# Patient Record
Sex: Male | Born: 1955 | Race: White | Hispanic: No | Marital: Single | Smoking: Current every day smoker
Health system: Southern US, Community
[De-identification: ages and names within clinical notes are randomized; demographics above are authoritative.]

---

## 2013-05-09 ENCOUNTER — Emergency Department: Payer: Self-pay | Admitting: Emergency Medicine

## 2015-07-20 ENCOUNTER — Emergency Department
Admission: EM | Admit: 2015-07-20 | Discharge: 2015-07-20 | Disposition: A | Discharge status: Home / Self Care | Payer: Self-pay | Attending: Emergency Medicine | Admitting: Emergency Medicine

## 2015-07-20 ENCOUNTER — Other Ambulatory Visit: Payer: Self-pay

## 2015-07-20 ENCOUNTER — Emergency Department: Payer: Self-pay

## 2015-07-20 ENCOUNTER — Encounter: Payer: Self-pay | Admitting: Emergency Medicine

## 2015-07-20 DIAGNOSIS — K253 Acute gastric ulcer without hemorrhage or perforation: Secondary | ICD-10-CM | POA: Insufficient documentation

## 2015-07-20 DIAGNOSIS — F17210 Nicotine dependence, cigarettes, uncomplicated: Secondary | ICD-10-CM | POA: Insufficient documentation

## 2015-07-20 DIAGNOSIS — Z880 Allergy status to penicillin: Secondary | ICD-10-CM | POA: Insufficient documentation

## 2015-07-20 LAB — HEPATIC FUNCTION PANEL
ALT: 10 U/L — ABNORMAL LOW (ref 17–63)
AST: 16 U/L (ref 15–41)
Albumin: 3.7 g/dL (ref 3.5–5.0)
Alkaline Phosphatase: 83 U/L (ref 38–126)
BILIRUBIN TOTAL: 0.3 mg/dL (ref 0.3–1.2)
Total Protein: 7.4 g/dL (ref 6.5–8.1)

## 2015-07-20 LAB — BASIC METABOLIC PANEL
Anion gap: 7 (ref 5–15)
BUN: 10 mg/dL (ref 6–20)
CHLORIDE: 98 mmol/L — AB (ref 101–111)
CO2: 31 mmol/L (ref 22–32)
CREATININE: 1.01 mg/dL (ref 0.61–1.24)
Calcium: 9.2 mg/dL (ref 8.9–10.3)
GFR calc Af Amer: >60 mL/min (ref >60)
GFR calc non Af Amer: >60 mL/min (ref >60)
Glucose, Bld: 117 mg/dL — ABNORMAL HIGH (ref 65–99)
Potassium: 4.1 mmol/L (ref 3.5–5.1)
Sodium: 136 mmol/L (ref 135–145)

## 2015-07-20 LAB — CBC
HCT: 36 % — ABNORMAL LOW (ref 40.0–52.0)
Hemoglobin: 12.4 g/dL — ABNORMAL LOW (ref 13.0–18.0)
MCH: 32.6 pg (ref 26.0–34.0)
MCHC: 34.4 g/dL (ref 32.0–36.0)
MCV: 94.5 fL (ref 80.0–100.0)
PLATELETS: 264 10*3/uL (ref 150–440)
RBC: 3.81 MIL/uL — AB (ref 4.40–5.90)
RDW: 13.3 % (ref 11.5–14.5)
WBC: 11.6 10*3/uL — ABNORMAL HIGH (ref 3.8–10.6)

## 2015-07-20 LAB — LIPASE, BLOOD: Lipase: 39 U/L (ref 11–51)

## 2015-07-20 LAB — TROPONIN I: Troponin I: <0.03 ng/mL (ref <0.031)

## 2015-07-20 MED ORDER — SUCRALFATE 1 G PO TABS: 1.0000 g | ORAL_TABLET | Freq: Four times a day (QID) | ORAL | Status: AC

## 2015-07-20 MED ORDER — SODIUM CHLORIDE 0.9 % IV BOLUS (SEPSIS)
1000.0000 mL | Freq: Once | INTRAVENOUS | Status: AC
  Administered 2015-07-20: 1000 mL via INTRAVENOUS

## 2015-07-20 MED ORDER — ONDANSETRON HCL 4 MG/2ML IJ SOLN
4.0000 mg | Freq: Once | INTRAMUSCULAR | Status: AC
  Administered 2015-07-20: 4 mg via INTRAVENOUS
  Filled 2015-07-20: qty 2

## 2015-07-20 MED ORDER — IOHEXOL 240 MG/ML SOLN
25.0000 mL | Freq: Once | INTRAMUSCULAR | Status: AC | PRN
  Administered 2015-07-20: 25 mL via INTRAVENOUS

## 2015-07-20 MED ORDER — GI COCKTAIL ~~LOC~~
30.0000 mL | Freq: Once | ORAL | Status: AC
  Administered 2015-07-20: 30 mL via ORAL
  Filled 2015-07-20: qty 30

## 2015-07-20 MED ORDER — OMEPRAZOLE 20 MG PO CPDR: 20.0000 mg | DELAYED_RELEASE_CAPSULE | Freq: Two times a day (BID) | ORAL | Status: AC

## 2015-07-20 MED ORDER — MORPHINE SULFATE (PF) 4 MG/ML IV SOLN
4.0000 mg | Freq: Once | INTRAVENOUS | Status: AC
  Administered 2015-07-20: 4 mg via INTRAVENOUS
  Filled 2015-07-20: qty 1

## 2015-07-20 MED ORDER — METRONIDAZOLE 500 MG PO TABS: 500.0000 mg | ORAL_TABLET | Freq: Two times a day (BID) | ORAL | Status: AC

## 2015-07-20 MED ORDER — IOHEXOL 300 MG/ML  SOLN
100.0000 mL | Freq: Once | INTRAMUSCULAR | Status: AC | PRN
  Administered 2015-07-20: 100 mL via INTRAVENOUS

## 2015-07-20 MED ORDER — CLARITHROMYCIN 500 MG PO TABS: 500.0000 mg | ORAL_TABLET | Freq: Two times a day (BID) | ORAL | Status: AC

## 2015-07-20 NOTE — ED Notes (Signed)
Patient transported to X-ray 

## 2015-07-20 NOTE — ED Provider Notes (Signed)
[facility] Emergency Department Provider Note   ____________________________________________  Time seen: 1405  I have reviewed the triage vital signs and the nursing notes.   HISTORY  Chief Complaint Abdominal Pain   History limited by: Not Limited   HPI Ricky Hopkins is a 59 y.o. male who presents to the emergency department today because of concerns for abdominal pain. He states this been located in the epigastric region. He states he feels like someone is checking a knife in his abdomen. The pain radiates to his back. It is been present for the past 2 weeks. It is worse after eating. He denies any nausea or vomiting with the pain. He states that he has had some diarrhea although he has not noticed any blood in it recently. He denies any fevers. Denies similar symptoms in the past.   History reviewed. No pertinent past medical history.  There are no active problems to display for this patient.   History reviewed. No pertinent past surgical history.  No current outpatient prescriptions on file.  Allergies Penicillins  No family history on file.  Social History Social History  Substance Use Topics  . Smoking status: Current Every Day Smoker -- 1.00 packs/day    Types: Cigarettes  . Smokeless tobacco: None  . Alcohol Use: No    Review of Systems  Constitutional: Negative for fever. Cardiovascular: Negative for chest pain. Respiratory: Negative for shortness of breath. Gastrointestinal: positive for epigastric abdominal pain Genitourinary: Negative for dysuria. Musculoskeletal: Negative for back pain. Skin: Negative for rash. Neurological: Negative for headaches, focal weakness or numbness.  10-point ROS otherwise negative.  ____________________________________________   PHYSICAL EXAM:  VITAL SIGNS: ED Triage Vitals  Enc Vitals Group     BP 07/20/15 1248 123/70 mmHg     Pulse Rate 07/20/15 1248 58     Resp 07/20/15 1248 19    Temp 07/20/15 1248 97.6 F (36.4 C)     Temp Source 07/20/15 1248 Oral     SpO2 07/20/15 1248 100 %     Weight 07/20/15 1248 130 lb (58.968 kg)     Height 07/20/15 1248 5\' 4"  (1.626 m)     Head Cir --      Peak Flow --      Pain Score 07/20/15 1307 8   Constitutional: Alert and oriented. Thin, older than his stated age Eyes: Conjunctivae are normal. PERRL. Normal extraocular movements. ENT   Head: Normocephalic and atraumatic.   Nose: No congestion/rhinnorhea.   Mouth/Throat: Mucous membranes are moist.   Neck: No stridor. Hematological/Lymphatic/Immunilogical: No cervical lymphadenopathy. Cardiovascular: Normal rate, regular rhythm.  No murmurs, rubs, or gallops. Respiratory: Normal respiratory effort without tachypnea nor retractions. Breath sounds are clear and equal bilaterally. No wheezes/rales/rhonchi. Gastrointestinal: Soft and tender to palpation epigastrium. No rebound. No guarding. No distention.  Genitourinary: Deferred Musculoskeletal: Normal range of motion in all extremities. No joint effusions.  No lower extremity tenderness nor edema. Neurologic:  Normal speech and language. No gross focal neurologic deficits are appreciated.  Skin:  Skin is warm, dry and intact. No rash noted. Psychiatric: Mood and affect are normal. Speech and behavior are normal. Patient exhibits appropriate insight and judgment.  ____________________________________________    LABS (pertinent positives/negatives)  Labs Reviewed  BASIC METABOLIC PANEL - Abnormal; Notable for the following:    Chloride 98 (*)    Glucose, Bld 117 (*)    All other components within normal limits  CBC - Abnormal; Notable for the following:  WBC 11.6 (*)    RBC 3.81 (*)    Hemoglobin 12.4 (*)    HCT 36.0 (*)    All other components within normal limits  HEPATIC FUNCTION PANEL - Abnormal; Notable for the following:    ALT 10 (*)    Bilirubin, Direct <0.1 (*)    All other components within  normal limits  TROPONIN I  LIPASE, BLOOD     ____________________________________________   EKG  I, Phineas Semen, attending physician, personally viewed and interpreted this EKG  EKG Time: 1256 Rate: 74 Rhythm: NSR Axis: normal Intervals: qtc 397 QRS: narrow ST changes: no st elevation Impression: normal ekg ____________________________________________    RADIOLOGY  CXR  IMPRESSION: COPD. No active cardiopulmonary disease.  CT abd/pel  IMPRESSION: Findings likely representing an inflamed gastric ulcer along the lesser curvature of the stomach. No evidence of perforation. Acute pancreatitis is unlikely, but not excluded.  Mild emphysematous disease.   ____________________________________________   PROCEDURES  Procedure(s) performed: None  Critical Care performed: No  ____________________________________________   INITIAL IMPRESSION / ASSESSMENT AND PLAN / ED COURSE  Pertinent labs & imaging results that were available during my care of the patient were reviewed by me and considered in my medical decision making (see chart for details).  Patient presented to the emergency department today with 2 weeks of epigastric abdominal pain. CT scan was performed which showed signs concerning for a gastric ulcer. No signs of perforation. Blood work without any concerning findings. I believe a gastric ulcer would explain patient's symptoms. We will discharge home with triple therapy. Will give GI follow up. Discussed return precautions.  ____________________________________________   FINAL CLINICAL IMPRESSION(S) / ED DIAGNOSES  Final diagnoses:  Acute gastric ulcer     Phineas Semen, MD 07/21/15 1444

## 2015-07-20 NOTE — ED Notes (Signed)
Patient returned from X-ray 

## 2015-07-20 NOTE — ED Notes (Signed)
Patient presents to the ED with epigastric pain that radiates into back and shoulder blades x 2 weeks.  Patient denies vomiting and diarrhea.  Patient states pain feels very sharp and is intermittent.  Patient reports lack of appetite.  Patient states, "when I eat, it feels like it blows my stomach up."  Patient is alert and oriented, ambulatory to triage.

## 2015-07-20 NOTE — Discharge Instructions (Signed)
Please seek medical attention for any high fevers, chest pain, shortness of breath, change in behavior, persistent vomiting, bloody stool or any other new or concerning symptoms.   Peptic Ulcer A peptic ulcer is a sore in the lining of your esophagus (esophageal ulcer), stomach (gastric ulcer), or in the first part of your small intestine (duodenal ulcer). The ulcer causes erosion into the deeper tissue. CAUSES  Normally, the lining of the stomach and the small intestine protects itself from the acid that digests food. The protective lining can be damaged by:  An infection caused by a bacterium called Helicobacter pylori (H. pylori).  Regular use of nonsteroidal anti-inflammatory drugs (NSAIDs), such as ibuprofen or aspirin.  Smoking tobacco. Other risk factors include being older than 50, drinking alcohol excessively, and having a family history of ulcer disease.  SYMPTOMS   Burning pain or gnawing in the area between the chest and the belly button.  Heartburn.  Nausea and vomiting.  Bloating. The pain can be worse on an empty stomach and at night. If the ulcer results in bleeding, it can cause:  Black, tarry stools.  Vomiting of bright red blood.  Vomiting of coffee-ground-looking materials. DIAGNOSIS  A diagnosis is usually made based upon your history and an exam. Other tests and procedures may be performed to find the cause of the ulcer. Finding a cause will help determine the best treatment. Tests and procedures may include:  Blood tests, stool tests, or breath tests to check for the bacterium H. pylori.  An upper gastrointestinal (GI) series of the esophagus, stomach, and small intestine.  An endoscopy to examine the esophagus, stomach, and small intestine.  A biopsy. TREATMENT  Treatment may include:  Eliminating the cause of the ulcer, such as smoking, NSAIDs, or alcohol.  Medicines to reduce the amount of acid in your digestive tract.  Antibiotic medicines if  the ulcer is caused by the H. pylori bacterium.  An upper endoscopy to treat a bleeding ulcer.  Surgery if the bleeding is severe or if the ulcer created a hole somewhere in the digestive system. HOME CARE INSTRUCTIONS   Avoid tobacco, alcohol, and caffeine. Smoking can increase the acid in the stomach, and continued smoking will impair the healing of ulcers.  Avoid foods and drinks that seem to cause discomfort or aggravate your ulcer.  Only take medicines as directed by your caregiver. Do not substitute over-the-counter medicines for prescription medicines without talking to your caregiver.  Keep any follow-up appointments and tests as directed. SEEK MEDICAL CARE IF:   Your do not improve within 7 days of starting treatment.  You have ongoing indigestion or heartburn. SEEK IMMEDIATE MEDICAL CARE IF:   You have sudden, sharp, or persistent abdominal pain.  You have bloody or dark black, tarry stools.  You vomit blood or vomit that looks like coffee grounds.  You become light-headed, weak, or feel faint.  You become sweaty or clammy. MAKE SURE YOU:   Understand these instructions.  Will watch your condition.  Will get help right away if you are not doing well or get worse.   This information is not intended to replace advice given to you by your health care provider. Make sure you discuss any questions you have with your health care provider.   Document Released: 08/06/2000 Document Revised: 08/30/2014 Document Reviewed: 03/08/2012 Elsevier Interactive Patient Education [employer]2016 [employer]

## 2015-07-20 NOTE — ED Notes (Signed)
Discussed discharge instructions, prescriptions, and follow-up care with patient. No questions or concerns at this time. Pt stable at discharge.  

## 2020-06-12 ENCOUNTER — Encounter: Payer: Self-pay | Admitting: Intensive Care

## 2020-06-12 ENCOUNTER — Emergency Department: Payer: Medicare Other

## 2020-06-12 ENCOUNTER — Other Ambulatory Visit: Payer: Self-pay

## 2020-06-12 ENCOUNTER — Emergency Department
Admission: EM | Admit: 2020-06-12 | Discharge: 2020-06-12 | Disposition: A | Discharge status: Home / Self Care | Payer: Medicare Other | Attending: Emergency Medicine | Admitting: Emergency Medicine

## 2020-06-12 DIAGNOSIS — M791 Myalgia, unspecified site: Secondary | ICD-10-CM

## 2020-06-12 DIAGNOSIS — F17210 Nicotine dependence, cigarettes, uncomplicated: Secondary | ICD-10-CM | POA: Diagnosis not present

## 2020-06-12 DIAGNOSIS — M25551 Pain in right hip: Secondary | ICD-10-CM | POA: Diagnosis not present

## 2020-06-12 DIAGNOSIS — M25571 Pain in right ankle and joints of right foot: Secondary | ICD-10-CM | POA: Diagnosis not present

## 2020-06-12 MED ORDER — NAPROXEN 500 MG PO TABS: 500.0000 mg | ORAL_TABLET | Freq: Two times a day (BID) | ORAL | Status: AC

## 2020-06-12 MED ORDER — LIDOCAINE 5 % EX PTCH
1.0000 | MEDICATED_PATCH | CUTANEOUS | Status: DC
  Administered 2020-06-12: 1 via TRANSDERMAL
  Filled 2020-06-12: qty 1

## 2020-06-12 MED ORDER — NAPROXEN 500 MG PO TABS
500.0000 mg | ORAL_TABLET | Freq: Once | ORAL | Status: AC
  Administered 2020-06-12: 500 mg via ORAL
  Filled 2020-06-12: qty 1

## 2020-06-12 MED ORDER — LIDOCAINE 5 % EX PTCH: 1.0000 | MEDICATED_PATCH | Freq: Two times a day (BID) | CUTANEOUS | 0 refills | Status: AC

## 2020-06-12 NOTE — ED Provider Notes (Signed)
[facility] Emergency Department Provider Note   ____________________________________________   First MD Initiated Contact with Patient 06/12/20 [phone]     (approximate)  I have reviewed the triage vital signs and the nursing notes.   HISTORY  Chief Complaint Leg Pain    HPI Ricky Hopkins is a 64 y.o. male patient complain of right hip and right ankle x-ray.  Patient stated right hip pain has increased over the past month along with the ankle.  Patient states he was knocked down by a bull 3 months ago and the pain has worsened.  Did not seek medical attention 3 months ago after the incident with the  Bull.  Patient rates pain as a 10/10.  Patient scribed pain is "achy".  No relief over-the-counter anti-inflammatory medications..      History reviewed. No pertinent past medical history.  There are no problems to display for this patient.   History reviewed. No pertinent surgical history.  Prior to Admission medications   Medication Sig Start Date End Date Taking? Authorizing Provider  lidocaine (LIDODERM) 5 % Place 1 patch onto the skin every 12 (twelve) hours. Remove & Discard patch within 12 hours or as directed by MD 06/12/20 06/12/21  Joni Reining, PA-C  naproxen (NAPROSYN) 500 MG tablet Take 1 tablet (500 mg total) by mouth 2 (two) times daily with a meal. 06/12/20   Joni Reining, PA-C  omeprazole (PRILOSEC) 20 MG capsule Take 1 capsule (20 mg total) by mouth 2 (two) times daily. 07/20/15 07/19/16  Phineas Semen, MD  sucralfate (CARAFATE) 1 G tablet Take 1 tablet (1 g total) by mouth 4 (four) times daily. 07/20/15   Phineas Semen, MD    Allergies Penicillins  History reviewed. No pertinent family history.  Social History Social History   Tobacco Use  . Smoking status: Current Every Day Smoker    Packs/day: 1.00    Types: Cigarettes  . Smokeless tobacco: Never Used  Substance Use Topics  . Alcohol use: No  . Drug use: Not  on file    Review of Systems Constitutional: No fever/chills Eyes: No visual changes. ENT: No sore throat. Cardiovascular: Denies chest pain. Respiratory: Denies shortness of breath. Gastrointestinal: No abdominal pain.  No nausea, no vomiting.  No diarrhea.  No constipation. Genitourinary: Negative for dysuria. Musculoskeletal: Negative for back pain. Skin: Negative for rash. Neurological: Negative for headaches, focal weakness or numbness. Psychiatric:  __________________   PHYSICAL EXAM:  VITAL SIGNS: ED Triage Vitals  Enc Vitals Group     BP 06/12/20 0915 (!) 193/82     Pulse Rate 06/12/20 0915 86     Resp 06/12/20 0915 16     Temp 06/12/20 0915 97.8 F (36.6 C)     Temp Source 06/12/20 0915 Oral     SpO2 06/12/20 0915 100 %     Weight 06/12/20 0914 130 lb (59 kg)     Height 06/12/20 0914 4\' 10"  (1.473 m)     Head Circumference --      Peak Flow --      Pain Score 06/12/20 0918 10     Pain Loc --      Pain Edu? --      Excl. in GC? --     Constitutional: Alert and oriented. Well appearing and in no acute distress. Eyes: Conjunctivae are normal. PERRL. EOMI. Head: Atraumatic. Nose: No congestion/rhinnorhea. Mouth/Throat: Mucous membranes are moist.  Oropharynx non-erythematous. Neck: No stridor. Hematological/Lymphatic/Immunilogical: No cervical  lymphadenopathy. Cardiovascular: Normal rate, regular rhythm. Grossly normal heart sounds.  Good peripheral circulation.  Good blood pressure. Respiratory: Normal respiratory effort.  No retractions. Lungs CTAB. Gastrointestinal: Soft and nontender. No distention. No abdominal bruits. No CVA tenderness. Genitourinary:  Musculoskeletal: No obvious deformity to the right hip.  No leg length discrepancy.  Patient is moderate guarding palpation to greater trochanter.  Patient is able to bear weight.  No obvious deformity to the ankle.  No edema or ecchymosis.  Patient has some mild guarding palpation of the lateral  malleolus.No lower extremity tenderness nor edema.  No joint effusions. Neurologic:  Normal speech and language. No gross focal neurologic deficits are appreciated. No gait instability. Skin:  Skin is warm, dry and intact. No rash noted. Psychiatric: Mood and affect are normal. Speech and behavior are normal.  ____________________________________________   LABS (all labs ordered are listed, but only abnormal results are displayed)  Labs Reviewed - No data to display ____________________________________________  EKG   ____________________________________________  RADIOLOGY I, Joni Reining, personally viewed and evaluated these images (plain radiographs) as part of my medical decision making, as well as reviewing the written report by the radiologist.  ED MD interpretation: No acute findings on x-ray of the right hip and right ankle.  Official radiology report(s): DG Ankle Complete Right  Result Date: 06/12/2020 CLINICAL DATA:  Worsening right leg pain for 1 month. Fall 3 months ago. EXAM: RIGHT ANKLE - COMPLETE 3+ VIEW COMPARISON:  None. FINDINGS: There is no evidence of fracture, dislocation, or joint effusion. There is no evidence of arthropathy or other focal bone abnormality. Soft tissues are unremarkable. IMPRESSION: Negative. Electronically Signed   By: Sebastian Ache M.D.   On: 06/12/2020 10:16   DG Hip Unilat W or Wo Pelvis 2-3 Views Right  Result Date: 06/12/2020 CLINICAL DATA:  Pain after fall. EXAM: DG HIP (WITH OR WITHOUT PELVIS) 2-3V RIGHT COMPARISON:  None. FINDINGS: There is no evidence of hip fracture or dislocation. Mild bilateral hip degenerative change. Vascular calcifications. IMPRESSION: Negative. Electronically Signed   By: Feliberto Harts MD   On: 06/12/2020 10:15    ____________________________________________   PROCEDURES  Procedure(s) performed (including Critical Care):  Procedures   ____________________________________________   INITIAL  IMPRESSION / ASSESSMENT AND PLAN / ED COURSE  As part of my medical decision making, I reviewed the following data within the electronic MEDICAL RECORD NUMBER     Patient presents with right hip and right ankle pain secondary to pain knocked over by a bull 3 months ago.  Discussed no acute findings x-ray of the right hip and right ankle.  Patient given discharge care instructions.  Patient is elevated blood pressure.  Ad[facility]  Take medication as directed.          ____________________________________________   FINAL CLINICAL IMPRESSION(S) / ED DIAGNOSES  Final diagnoses:  Pain on movement of skeletal muscle     ED Discharge Orders         Ordered    lidocaine (LIDODERM) 5 %  Every 12 hours        06/12/20 1043    naproxen (NAPROSYN) 500 MG tablet  2 times daily with meals        06/12/20 1043          *Please note:  Ricky Hopkins was evaluated in Emergency Department on 06/12/2020 for the symptoms described in the history of present illness. He was evaluated in the context  of the global COVID-19 pandemic, which necessitated consideration that the patient might be at risk for infection with the SARS-CoV-2 virus that causes COVID-19. Institutional protocols and algorithms that pertain to the evaluation of patients at risk for COVID-19 are in a state of rapid change based on information released by regulatory bodies including the CDC and federal and state organizations. These policies and algorithms were followed during the patient's care in the ED.  Some ED evaluations and interventions may be delayed as a result of limited staffing during and the pandemic.*   Note:  This document was prepared using Dragon voice recognition software and may include unintentional dictation errors.    Joni Reining, PA-C 06/12/20 1056    Phineas Semen, MD 06/12/20 1056

## 2020-06-12 NOTE — Discharge Instructions (Addendum)
Follow discharge care instruction. 

## 2020-06-12 NOTE — ED Triage Notes (Signed)
Patient c/o right leg pain that starts in hip and radiates down to ankle. Right leg pain started around a month ago and has progressively gotten worse. Reports he fell about three months ago after a bull knocked him down falling on his right hip.

## 2022-03-29 IMAGING — CR DG ANKLE COMPLETE 3+V*R*
3 series · 3 of 3 positions shown · non-contrast
Comparison: None.

CLINICAL DATA: Worsening right leg pain for 1 month. Fall 3 months
ago.

EXAM:
RIGHT ANKLE - COMPLETE 3+ VIEW

[ankle ap]
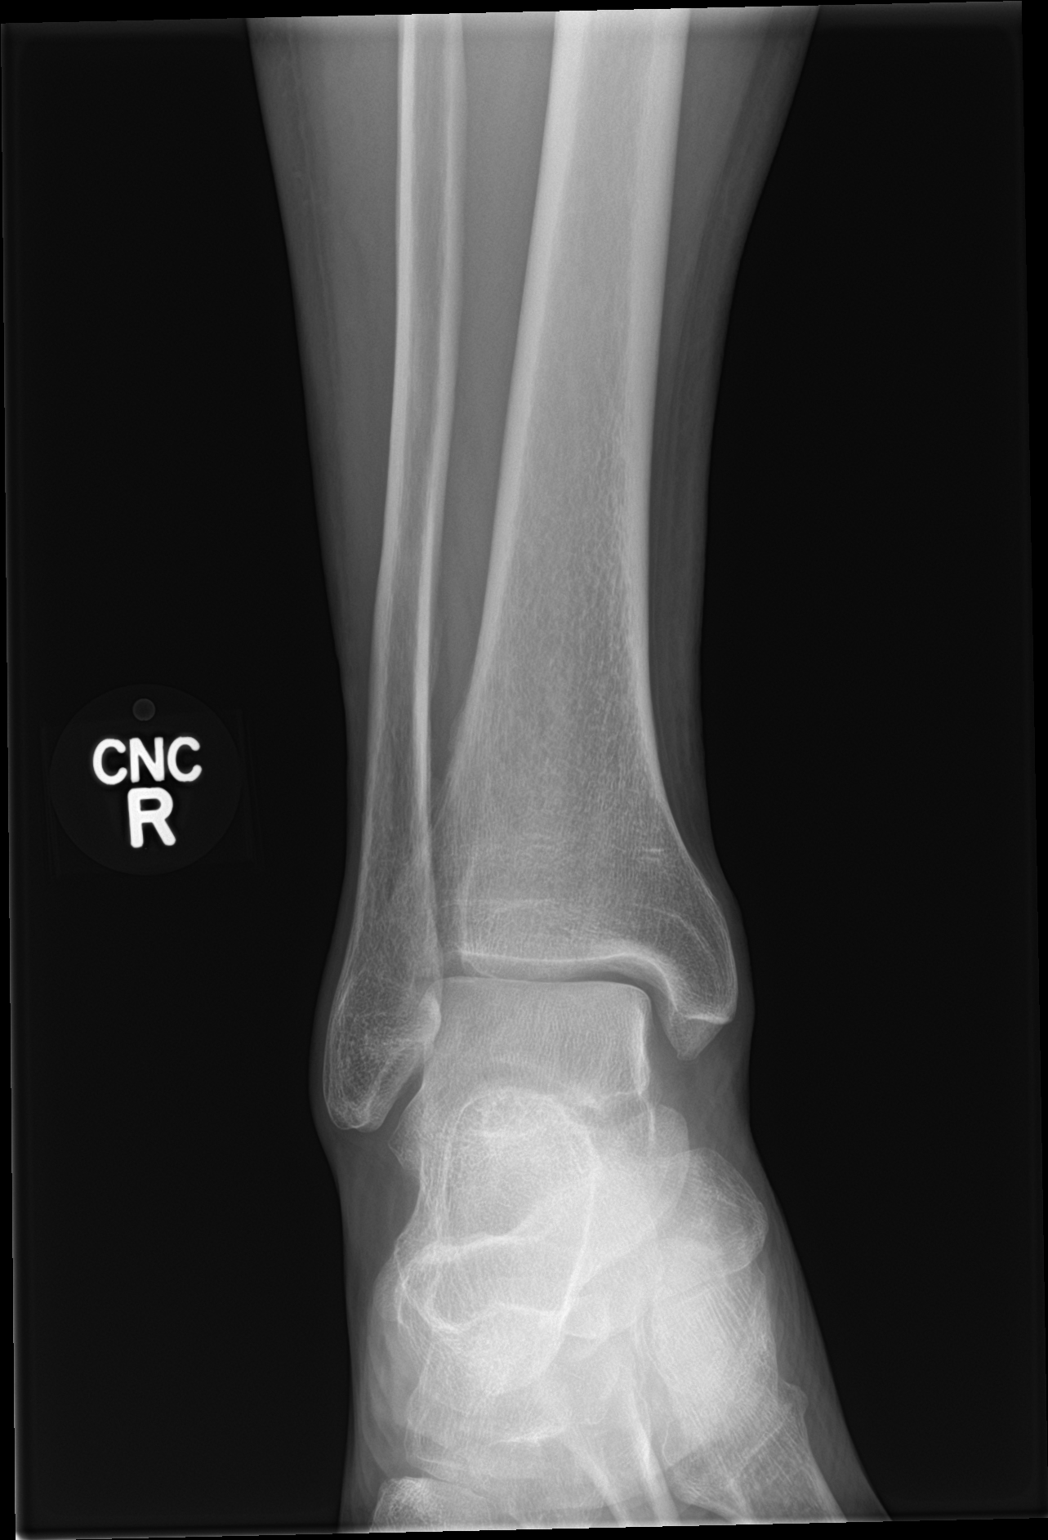

[ankle obl]
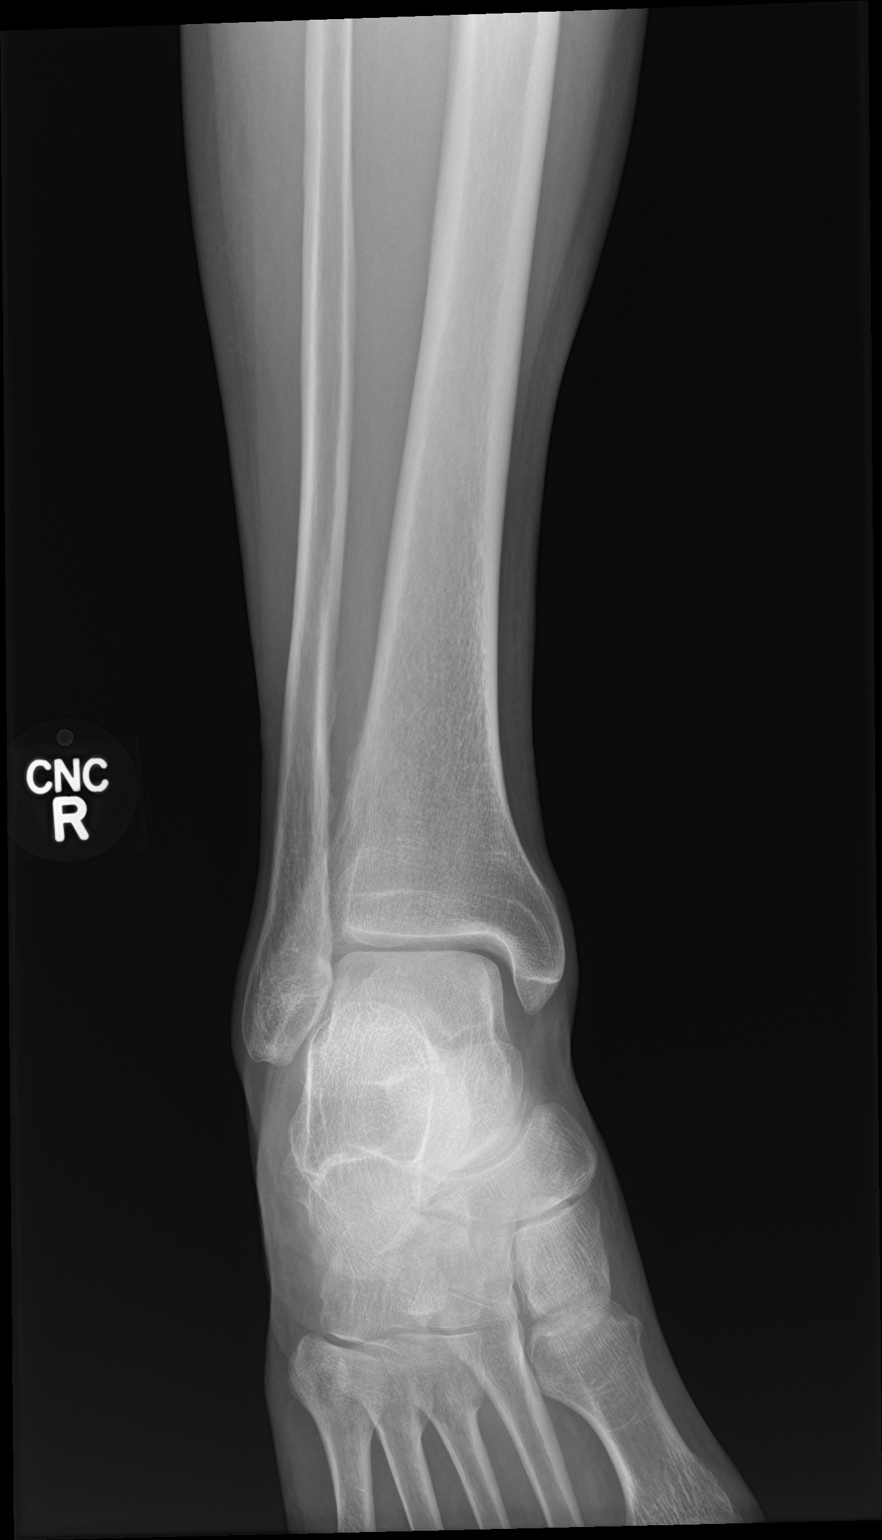

[ankle lat]
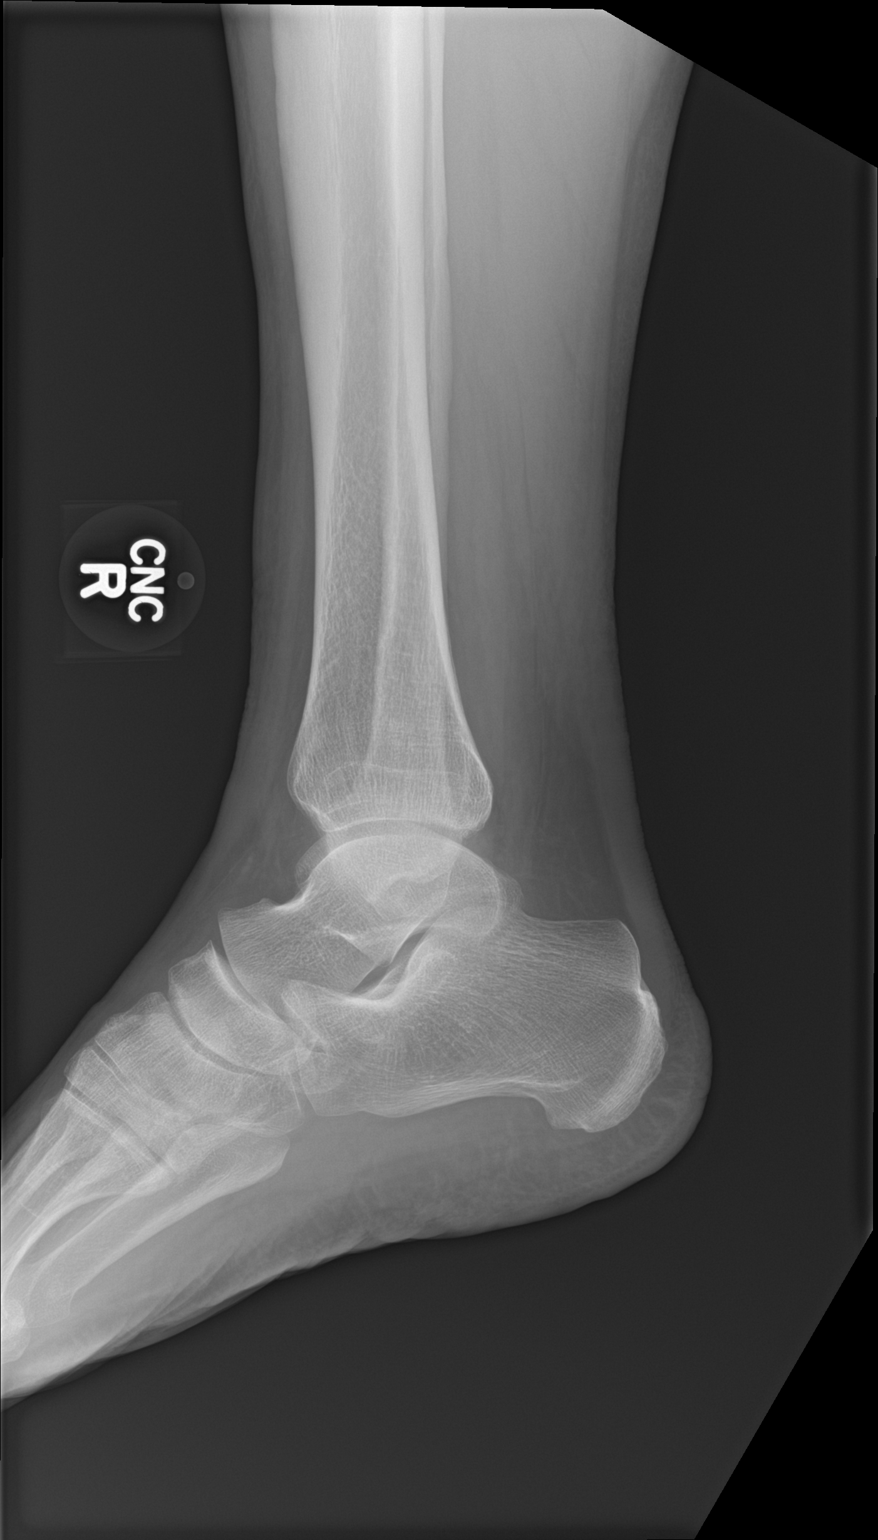

[3 of 3 positions shown; findings below may reference images not displayed]

FINDINGS: There is no evidence of fracture, dislocation, or joint effusion.
There is no evidence of arthropathy or other focal bone abnormality.
Soft tissues are unremarkable.
IMPRESSION: Negative.
